# Patient Record
Sex: Male | Born: 1995 | Race: White | Hispanic: No | Marital: Single | Smoking: Current some day smoker
Health system: Southern US, Community
[De-identification: ages and names within clinical notes are randomized; demographics above are authoritative.]

## PROBLEM LIST (undated history)

## (undated) DIAGNOSIS — Z85828 Personal history of other malignant neoplasm of skin: Secondary | ICD-10-CM

## (undated) DIAGNOSIS — L309 Dermatitis, unspecified: Secondary | ICD-10-CM

## (undated) DIAGNOSIS — F909 Attention-deficit hyperactivity disorder, unspecified type: Secondary | ICD-10-CM

## (undated) DIAGNOSIS — J45909 Unspecified asthma, uncomplicated: Secondary | ICD-10-CM

## (undated) HISTORY — PX: WISDOM TOOTH EXTRACTION: SHX21

## (undated) HISTORY — PX: TONSILLECTOMY: SUR1361

---
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
## 2018-02-28 ENCOUNTER — Ambulatory Visit (INDEPENDENT_AMBULATORY_CARE_PROVIDER_SITE_OTHER)
Admission: EM | Admit: 2018-02-28 | Discharge: 2018-02-28 | Disposition: A | Discharge status: Home / Self Care | Payer: Managed Care, Other (non HMO) | Source: Home / Self Care

## 2018-02-28 ENCOUNTER — Encounter (HOSPITAL_COMMUNITY): Payer: Self-pay | Admitting: Emergency Medicine

## 2018-02-28 ENCOUNTER — Emergency Department (HOSPITAL_COMMUNITY)
Admission: EM | Admit: 2018-02-28 | Discharge: 2018-03-01 | Disposition: A | Discharge status: Home / Self Care | Payer: Managed Care, Other (non HMO) | Attending: Emergency Medicine | Admitting: Emergency Medicine

## 2018-02-28 ENCOUNTER — Other Ambulatory Visit: Payer: Self-pay

## 2018-02-28 ENCOUNTER — Encounter (HOSPITAL_COMMUNITY): Payer: Self-pay

## 2018-02-28 DIAGNOSIS — Y33XXXA Other specified events, undetermined intent, initial encounter: Secondary | ICD-10-CM | POA: Insufficient documentation

## 2018-02-28 DIAGNOSIS — Z5321 Procedure and treatment not carried out due to patient leaving prior to being seen by health care provider: Secondary | ICD-10-CM | POA: Insufficient documentation

## 2018-02-28 DIAGNOSIS — S51811A Laceration without foreign body of right forearm, initial encounter: Secondary | ICD-10-CM | POA: Diagnosis not present

## 2018-02-28 DIAGNOSIS — W25XXXA Contact with sharp glass, initial encounter: Secondary | ICD-10-CM

## 2018-02-28 DIAGNOSIS — Y929 Unspecified place or not applicable: Secondary | ICD-10-CM | POA: Insufficient documentation

## 2018-02-28 DIAGNOSIS — Y998 Other external cause status: Secondary | ICD-10-CM | POA: Insufficient documentation

## 2018-02-28 DIAGNOSIS — Y939 Activity, unspecified: Secondary | ICD-10-CM | POA: Diagnosis not present

## 2018-02-28 DIAGNOSIS — S61512A Laceration without foreign body of left wrist, initial encounter: Secondary | ICD-10-CM | POA: Diagnosis not present

## 2018-02-28 NOTE — Discharge Instructions (Signed)
Patient was redirected to the ER.  His girlfriend was very upset and did not allow for appropriate discharge, I did not get a chance to provide patient with his AVS.

## 2018-02-28 NOTE — ED Triage Notes (Signed)
Pt here for a laceration for to left arm on Saturday. Wound is approximately 3 cm long.  No active bleeding.  Open wound wrapped with gauze.  A&Ox4

## 2018-02-28 NOTE — ED Triage Notes (Signed)
Pt sts laceration to right forearm after hitting glass table on Saturday night

## 2018-02-28 NOTE — ED Provider Notes (Signed)
  MRN: [id number] DOB: 1996-01-11  Subjective:   Paul Duke is a 23 y.o. male presenting for 2.5-day history of right forearm wound from handling glass.  Patient reports that he is try to keep his wound clean and wrapped.  Reports that his last Tdap was within the last 5 years.  Denies fever but admits that there is some redness of his forearm, reports a history of eczema.  No current facility-administered medications for this encounter.   Current Outpatient Medications:  .  amphetamine-dextroamphetamine (ADDERALL XR) 10 MG 24 hr capsule, Take 10 mg by mouth every morning., Disp: , Rfl:    No Known Allergies  Past Medical History:  Diagnosis Date  . Eczema      History reviewed. No pertinent surgical history.  ROS  Objective:   Vitals: BP 131/74 (BP Location: Left Arm)   Pulse 88   Temp 98.9 F (37.2 C) (Temporal)   Resp 18   SpO2 100%   Physical Exam Constitutional:      Appearance: Normal appearance. He is well-developed and normal weight.  HENT:     Head: Normocephalic and atraumatic.     Right Ear: External ear normal.     Left Ear: External ear normal.     Nose: Nose normal.     Mouth/Throat:     Pharynx: Oropharynx is clear.  Eyes:     Extraocular Movements: Extraocular movements intact.     Pupils: Pupils are equal, round, and reactive to light.  Cardiovascular:     Rate and Rhythm: Normal rate.  Pulmonary:     Effort: Pulmonary effort is normal.  Skin:         Comments: Over the flexor surface of his forearm he does have eczematous type rash.  Neurological:     Mental Status: He is alert and oriented to person, place, and time.  Psychiatric:        Mood and Affect: Mood normal.        Behavior: Behavior normal.    Assessment and Plan :   Laceration of right forearm, initial encounter  Patient's wound is approximately 48 hours old.  I examined patient with Dr. Marcille Blanco and we both agree that there is concern for cellulitis and potentially  developing systemic infection.  We redirected patient toward the ER for more emergent evaluation.  Patient contracted for safety and will report to the ER immediately.   Jaynee Eagles, PA-C 02/28/18 2003

## 2018-03-01 NOTE — ED Notes (Signed)
Pt didn't answer for vitals recheck x1

## 2018-03-01 NOTE — ED Notes (Signed)
Pt called again with no answer 

## 2019-04-18 ENCOUNTER — Other Ambulatory Visit: Payer: Self-pay | Admitting: Otolaryngology

## 2019-04-18 DIAGNOSIS — C44202 Unspecified malignant neoplasm of skin of right ear and external auricular canal: Secondary | ICD-10-CM

## 2019-04-25 ENCOUNTER — Other Ambulatory Visit: Payer: Managed Care, Other (non HMO)

## 2019-04-25 ENCOUNTER — Ambulatory Visit
Admission: RE | Admit: 2019-04-25 | Discharge: 2019-04-25 | Disposition: A | Discharge status: Home / Self Care | Payer: Managed Care, Other (non HMO) | Source: Ambulatory Visit | Attending: Otolaryngology | Admitting: Otolaryngology

## 2019-04-25 ENCOUNTER — Encounter (HOSPITAL_BASED_OUTPATIENT_CLINIC_OR_DEPARTMENT_OTHER): Payer: Self-pay | Admitting: Otolaryngology

## 2019-04-25 ENCOUNTER — Other Ambulatory Visit: Payer: Self-pay | Admitting: Otolaryngology

## 2019-04-25 DIAGNOSIS — C44202 Unspecified malignant neoplasm of skin of right ear and external auricular canal: Secondary | ICD-10-CM

## 2019-04-25 MED ORDER — IOPAMIDOL (ISOVUE-300) INJECTION 61%
75.0000 mL | Freq: Once | INTRAVENOUS | Status: AC | PRN
  Administered 2019-04-25: 13:00:00 75 mL via INTRAVENOUS

## 2019-04-26 ENCOUNTER — Other Ambulatory Visit: Payer: Self-pay

## 2019-04-26 ENCOUNTER — Encounter (HOSPITAL_BASED_OUTPATIENT_CLINIC_OR_DEPARTMENT_OTHER): Payer: Self-pay | Admitting: Otolaryngology

## 2019-04-27 ENCOUNTER — Other Ambulatory Visit (HOSPITAL_COMMUNITY)
Admission: RE | Admit: 2019-04-27 | Discharge: 2019-04-27 | Disposition: A | Discharge status: Home / Self Care | Payer: Managed Care, Other (non HMO) | Source: Ambulatory Visit | Attending: Otolaryngology | Admitting: Otolaryngology

## 2019-04-27 DIAGNOSIS — Z01812 Encounter for preprocedural laboratory examination: Secondary | ICD-10-CM | POA: Diagnosis not present

## 2019-04-27 DIAGNOSIS — Z20822 Contact with and (suspected) exposure to covid-19: Secondary | ICD-10-CM | POA: Diagnosis not present

## 2019-04-27 LAB — SARS CORONAVIRUS 2 (TAT 6-24 HRS): SARS Coronavirus 2: NEGATIVE

## 2019-05-01 ENCOUNTER — Ambulatory Visit (HOSPITAL_BASED_OUTPATIENT_CLINIC_OR_DEPARTMENT_OTHER)
Admission: RE | Admit: 2019-05-01 | Discharge: 2019-05-01 | Disposition: A | Discharge status: Home / Self Care | Payer: Managed Care, Other (non HMO) | Attending: Otolaryngology | Admitting: Otolaryngology

## 2019-05-01 ENCOUNTER — Ambulatory Visit (HOSPITAL_BASED_OUTPATIENT_CLINIC_OR_DEPARTMENT_OTHER): Payer: Managed Care, Other (non HMO) | Admitting: Anesthesiology

## 2019-05-01 ENCOUNTER — Encounter (HOSPITAL_BASED_OUTPATIENT_CLINIC_OR_DEPARTMENT_OTHER): Payer: Self-pay | Admitting: Otolaryngology

## 2019-05-01 ENCOUNTER — Encounter (HOSPITAL_BASED_OUTPATIENT_CLINIC_OR_DEPARTMENT_OTHER)
Admission: RE | Disposition: A | Discharge status: Home / Self Care | Payer: Self-pay | Source: Home / Self Care | Attending: Otolaryngology

## 2019-05-01 DIAGNOSIS — C44202 Unspecified malignant neoplasm of skin of right ear and external auricular canal: Secondary | ICD-10-CM | POA: Diagnosis present

## 2019-05-01 DIAGNOSIS — Z79899 Other long term (current) drug therapy: Secondary | ICD-10-CM | POA: Insufficient documentation

## 2019-05-01 DIAGNOSIS — F909 Attention-deficit hyperactivity disorder, unspecified type: Secondary | ICD-10-CM | POA: Diagnosis not present

## 2019-05-01 DIAGNOSIS — J45909 Unspecified asthma, uncomplicated: Secondary | ICD-10-CM | POA: Insufficient documentation

## 2019-05-01 DIAGNOSIS — F17200 Nicotine dependence, unspecified, uncomplicated: Secondary | ICD-10-CM | POA: Diagnosis not present

## 2019-05-01 HISTORY — DX: Attention-deficit hyperactivity disorder, unspecified type: F90.9

## 2019-05-01 HISTORY — DX: Personal history of other malignant neoplasm of skin: Z85.828

## 2019-05-01 HISTORY — DX: Unspecified asthma, uncomplicated: J45.909

## 2019-05-01 HISTORY — PX: LESION EXCISION WITH COMPLEX REPAIR: SHX6700

## 2019-05-01 SURGERY — LESION EXCISION WITH COMPLEX REPAIR
Anesthesia: General | Site: Ear | Laterality: Right

## 2019-05-01 MED ORDER — LIDOCAINE 2% (20 MG/ML) 5 ML SYRINGE
INTRAMUSCULAR | Status: DC | PRN
  Administered 2019-05-01: 60 mg via INTRAVENOUS

## 2019-05-01 MED ORDER — LACTATED RINGERS IV SOLN: INTRAVENOUS | Status: DC

## 2019-05-01 MED ORDER — ONDANSETRON HCL 4 MG/2ML IJ SOLN
INTRAMUSCULAR | Status: DC | PRN
  Administered 2019-05-01: 4 mg via INTRAVENOUS

## 2019-05-01 MED ORDER — CEFAZOLIN SODIUM-DEXTROSE 2-4 GM/100ML-% IV SOLN
2.0000 g | INTRAVENOUS | Status: AC
  Administered 2019-05-01: 2 g via INTRAVENOUS

## 2019-05-01 MED ORDER — CEFAZOLIN SODIUM-DEXTROSE 2-4 GM/100ML-% IV SOLN
INTRAVENOUS | Status: AC
  Filled 2019-05-01: qty 100

## 2019-05-01 MED ORDER — DEXAMETHASONE SODIUM PHOSPHATE 4 MG/ML IJ SOLN
INTRAMUSCULAR | Status: DC | PRN
  Administered 2019-05-01: 10 mg via INTRAVENOUS

## 2019-05-01 MED ORDER — ACETAMINOPHEN 500 MG PO TABS
ORAL_TABLET | ORAL | Status: AC
  Filled 2019-05-01: qty 2

## 2019-05-01 MED ORDER — MIDAZOLAM HCL 2 MG/2ML IJ SOLN
INTRAMUSCULAR | Status: AC
  Filled 2019-05-01: qty 2

## 2019-05-01 MED ORDER — HYDROMORPHONE HCL 1 MG/ML IJ SOLN: 0.2500 mg | INTRAMUSCULAR | Status: DC | PRN

## 2019-05-01 MED ORDER — CELECOXIB 200 MG PO CAPS
200.0000 mg | ORAL_CAPSULE | Freq: Once | ORAL | Status: AC
  Administered 2019-05-01: 200 mg via ORAL

## 2019-05-01 MED ORDER — FENTANYL CITRATE (PF) 100 MCG/2ML IJ SOLN
50.0000 ug | INTRAMUSCULAR | Status: DC | PRN
  Administered 2019-05-01: 100 ug via INTRAVENOUS
  Administered 2019-05-01: 12:00:00 50 ug via INTRAVENOUS

## 2019-05-01 MED ORDER — FENTANYL CITRATE (PF) 100 MCG/2ML IJ SOLN
INTRAMUSCULAR | Status: AC
  Filled 2019-05-01: qty 2

## 2019-05-01 MED ORDER — ACETAMINOPHEN 500 MG PO TABS
1000.0000 mg | ORAL_TABLET | Freq: Once | ORAL | Status: AC
  Administered 2019-05-01: 1000 mg via ORAL

## 2019-05-01 MED ORDER — PROPOFOL 10 MG/ML IV BOLUS
INTRAVENOUS | Status: DC | PRN
  Administered 2019-05-01: 200 mg via INTRAVENOUS

## 2019-05-01 MED ORDER — LIDOCAINE-EPINEPHRINE 1 %-1:100000 IJ SOLN
INTRAMUSCULAR | Status: DC | PRN
  Administered 2019-05-01: 3 mL

## 2019-05-01 MED ORDER — LIDOCAINE 2% (20 MG/ML) 5 ML SYRINGE
INTRAMUSCULAR | Status: AC
  Filled 2019-05-01: qty 5

## 2019-05-01 MED ORDER — DEXAMETHASONE SODIUM PHOSPHATE 10 MG/ML IJ SOLN
INTRAMUSCULAR | Status: AC
  Filled 2019-05-01: qty 1

## 2019-05-01 MED ORDER — MIDAZOLAM HCL 2 MG/2ML IJ SOLN
1.0000 mg | INTRAMUSCULAR | Status: DC | PRN
  Administered 2019-05-01: 2 mg via INTRAVENOUS

## 2019-05-01 MED ORDER — CELECOXIB 200 MG PO CAPS
ORAL_CAPSULE | ORAL | Status: AC
  Filled 2019-05-01: qty 1

## 2019-05-01 MED ORDER — ONDANSETRON HCL 4 MG/2ML IJ SOLN
INTRAMUSCULAR | Status: AC
  Filled 2019-05-01: qty 2

## 2019-05-01 SURGICAL SUPPLY — 61 items
BENZOIN TINCTURE PRP APPL 2/3 (GAUZE/BANDAGES/DRESSINGS) IMPLANT
BLADE SURG 10 STRL SS (BLADE) IMPLANT
BLADE SURG 15 STRL LF DISP TIS (BLADE) ×1 IMPLANT
BLADE SURG 15 STRL SS (BLADE) ×2
BNDG CONFORM 3 STRL LF (GAUZE/BANDAGES/DRESSINGS) IMPLANT
BNDG GAUZE ELAST 4 BULKY (GAUZE/BANDAGES/DRESSINGS) IMPLANT
CANISTER SUCT 1200ML W/VALVE (MISCELLANEOUS) IMPLANT
CLEANER CAUTERY TIP 5X5 PAD (MISCELLANEOUS) IMPLANT
CLOSURE WOUND 1/4X4 (GAUZE/BANDAGES/DRESSINGS)
CORD BIPOLAR FORCEPS 12FT (ELECTRODE) IMPLANT
COVER BACK TABLE 60X90IN (DRAPES) ×3 IMPLANT
COVER MAYO STAND STRL (DRAPES) ×3 IMPLANT
COVER WAND RF STERILE (DRAPES) IMPLANT
DECANTER SPIKE VIAL GLASS SM (MISCELLANEOUS) IMPLANT
DRAIN PENROSE 1/4X12 LTX STRL (WOUND CARE) IMPLANT
DRAPE U-SHAPE 76X120 STRL (DRAPES) ×3 IMPLANT
DRSG GLASSCOCK MASTOID ADT (GAUZE/BANDAGES/DRESSINGS) IMPLANT
ELECT COATED BLADE 2.86 ST (ELECTRODE) IMPLANT
ELECT NEEDLE BLADE 2-5/6 (NEEDLE) ×3 IMPLANT
ELECT REM PT RETURN 9FT ADLT (ELECTROSURGICAL) ×3
ELECTRODE REM PT RTRN 9FT ADLT (ELECTROSURGICAL) ×1 IMPLANT
GAUZE SPONGE 4X4 12PLY STRL LF (GAUZE/BANDAGES/DRESSINGS) IMPLANT
GAUZE XEROFORM 1X8 LF (GAUZE/BANDAGES/DRESSINGS) ×6 IMPLANT
GLOVE BIO SURGEON STRL SZ7.5 (GLOVE) ×3 IMPLANT
GOWN STRL REUS W/ TWL LRG LVL3 (GOWN DISPOSABLE) ×2 IMPLANT
GOWN STRL REUS W/TWL LRG LVL3 (GOWN DISPOSABLE) ×4
IV CATH 24GX3/4 RADIO (IV SOLUTION) IMPLANT
NEEDLE HYPO 25X1 1.5 SAFETY (NEEDLE) ×3 IMPLANT
NEEDLE PRECISIONGLIDE 27X1.5 (NEEDLE) IMPLANT
NS IRRIG 1000ML POUR BTL (IV SOLUTION) ×3 IMPLANT
PACK BASIN DAY SURGERY FS (CUSTOM PROCEDURE TRAY) ×3 IMPLANT
PAD CLEANER CAUTERY TIP 5X5 (MISCELLANEOUS)
PENCIL SMOKE EVACUATOR (MISCELLANEOUS) ×3 IMPLANT
SHEET MEDIUM DRAPE 40X70 STRL (DRAPES) IMPLANT
SPONGE GAUZE 2X2 8PLY STER LF (GAUZE/BANDAGES/DRESSINGS)
SPONGE GAUZE 2X2 8PLY STRL LF (GAUZE/BANDAGES/DRESSINGS) IMPLANT
STRIP CLOSURE SKIN 1/4X4 (GAUZE/BANDAGES/DRESSINGS) IMPLANT
SUCTION FRAZIER HANDLE 10FR (MISCELLANEOUS) ×2
SUCTION TUBE FRAZIER 10FR DISP (MISCELLANEOUS) ×1 IMPLANT
SUT CHROMIC 4 0 P 3 18 (SUTURE) IMPLANT
SUT ETHILON 4 0 PS 2 18 (SUTURE) ×9 IMPLANT
SUT ETHILON 5 0 P 3 18 (SUTURE)
SUT NYLON ETHILON 5-0 P-3 1X18 (SUTURE) IMPLANT
SUT PLAIN 5 0 P 3 18 (SUTURE) IMPLANT
SUT PROLENE 4 0 P 3 18 (SUTURE) IMPLANT
SUT PROLENE 5 0 P 3 (SUTURE) IMPLANT
SUT SILK 3 0 PS 1 (SUTURE) ×3 IMPLANT
SUT SILK 4 0 TIES 17X18 (SUTURE) IMPLANT
SUT VIC AB 4-0 P-3 18XBRD (SUTURE) IMPLANT
SUT VIC AB 4-0 P3 18 (SUTURE)
SUT VIC AB 5-0 P-3 18X BRD (SUTURE) IMPLANT
SUT VIC AB 5-0 P3 18 (SUTURE)
SWAB COLLECTION DEVICE MRSA (MISCELLANEOUS) IMPLANT
SWAB CULTURE ESWAB REG 1ML (MISCELLANEOUS) IMPLANT
SYR BULB 3OZ (MISCELLANEOUS) IMPLANT
SYR CONTROL 10ML LL (SYRINGE) ×3 IMPLANT
SYR TB 1ML LL NO SAFETY (SYRINGE) IMPLANT
TOWEL GREEN STERILE FF (TOWEL DISPOSABLE) ×3 IMPLANT
TRAY DSU PREP LF (CUSTOM PROCEDURE TRAY) ×3 IMPLANT
TUBE CONNECTING 20'X1/4 (TUBING)
TUBE CONNECTING 20X1/4 (TUBING) IMPLANT

## 2019-05-01 NOTE — Anesthesia Preprocedure Evaluation (Addendum)
Anesthesia Evaluation  Patient identified by MRN, date of birth, ID band Patient awake    Reviewed: Allergy & Precautions, H&P , NPO status , Patient's Chart, lab work & pertinent test results  Airway Mallampati: II  TM Distance: >3 FB Neck ROM: Full    Dental no notable dental hx. (+) Teeth Intact   Pulmonary asthma , Current Smoker,    Pulmonary exam normal        Cardiovascular negative cardio ROS Normal cardiovascular exam     Neuro/Psych negative neurological ROS  negative psych ROS   GI/Hepatic negative GI ROS, Neg liver ROS,   Endo/Other  negative endocrine ROS  Renal/GU negative Renal ROS  negative genitourinary   Musculoskeletal   Abdominal   Peds  Hematology negative hematology ROS (+)   Anesthesia Other Findings   Reproductive/Obstetrics negative OB ROS                            Anesthesia Physical Anesthesia Plan  ASA: II  Anesthesia Plan: General   Post-op Pain Management:    Induction: Intravenous  PONV Risk Score and Plan: 1 and Ondansetron, Dexamethasone, Midazolam and Treatment may vary due to age or medical condition  Airway Management Planned: Oral ETT  Additional Equipment: None  Intra-op Plan:   Post-operative Plan: Extubation in OR  Informed Consent: I have reviewed the patients History and Physical, chart, labs and discussed the procedure including the risks, benefits and alternatives for the proposed anesthesia with the patient or authorized representative who has indicated his/her understanding and acceptance.     Dental advisory given  Plan Discussed with: CRNA  Anesthesia Plan Comments:        Anesthesia Quick Evaluation

## 2019-05-01 NOTE — Brief Op Note (Signed)
05/01/2019  12:55 PM  PATIENT:  Paul Duke  24 y.o. male  PRE-OPERATIVE DIAGNOSIS:  RIGHT EAR MALIGNANT TUMOR  POST-OPERATIVE DIAGNOSIS:  RIGHT EAR MALIGNANT TUMOR  PROCEDURE:  Procedure(s): EXCISION RIGHT EAR LESION (Right) 2 cm  SURGEON:  Surgeon(s) and Role:    Melida Quitter, MD - Primary  PHYSICIAN ASSISTANT:   ASSISTANTS: none   ANESTHESIA:   general  EBL: Minimal  BLOOD ADMINISTERED:none  DRAINS: none   LOCAL MEDICATIONS USED:  LIDOCAINE   SPECIMEN:  Source of Specimen:  Right auricle tumor, long suture marks anterior, short suture marks superior  DISPOSITION OF SPECIMEN:  PATHOLOGY  COUNTS:  YES  TOURNIQUET:  * No tourniquets in log *  DICTATION: .Other Dictation: Dictation Number [id number]  PLAN OF CARE: Discharge to home after PACU  PATIENT DISPOSITION:  PACU - hemodynamically stable.   Delay start of Pharmacological VTE agent (>24hrs) due to surgical blood loss or risk of bleeding: no

## 2019-05-01 NOTE — Anesthesia Postprocedure Evaluation (Signed)
Anesthesia Post Note  Patient: Lakendrick Tomaselli  Procedure(s) Performed: EXCISION RIGHT EAR LESION (Right Ear)     Patient location during evaluation: PACU Anesthesia Type: General Level of consciousness: awake and alert Pain management: pain level controlled Vital Signs Assessment: post-procedure vital signs reviewed and stable Respiratory status: spontaneous breathing, nonlabored ventilation and respiratory function stable Cardiovascular status: blood pressure returned to baseline and stable Postop Assessment: no apparent nausea or vomiting Anesthetic complications: no    Last Vitals:  Vitals:   05/01/19 1330 05/01/19 1345  BP: 105/71 108/71  Pulse: 61 63  Resp: 14 14  Temp:    SpO2: 100% 100%    Last Pain:  Vitals:   05/01/19 1345  TempSrc:   PainSc: 0-No pain                 Lidia Collum

## 2019-05-01 NOTE — Discharge Instructions (Signed)
Call your surgeon if you experience:   1.  Fever over 101.0. 2.  Inability to urinate. 3.  Nausea and/or vomiting. 4.  Extreme swelling or bruising at the surgical site. 5.  Continued bleeding from the incision. 6.  Increased pain, redness or drainage from the incision. 7.  Problems related to your pain medication. 8.  Any problems and/or concerns   NO TYLENOL PRODUCTS UNTIL 4:30 PM   NO ADVIL/ MOTRIN UNTIL 6:30 PM       Post Anesthesia Home Care Instructions  Activity: Get plenty of rest for the remainder of the day. A responsible individual must stay with you for 24 hours following the procedure.  For the next 24 hours, DO NOT: -Drive a car -Paediatric nurse -Drink alcoholic beverages -Take any medication unless instructed by your physician -Make any legal decisions or sign important papers.  Meals: Start with liquid foods such as gelatin or soup. Progress to regular foods as tolerated. Avoid greasy, spicy, heavy foods. If nausea and/or vomiting occur, drink only clear liquids until the nausea and/or vomiting subsides. Call your physician if vomiting continues.  Special Instructions/Symptoms: Your throat may feel dry or sore from the anesthesia or the breathing tube placed in your throat during surgery. If this causes discomfort, gargle with warm salt water. The discomfort should disappear within 24 hours.  If you had a scopolamine patch placed behind your ear for the management of post- operative nausea and/or vomiting:  1. The medication in the patch is effective for 72 hours, after which it should be removed.  Wrap patch in a tissue and discard in the trash. Wash hands thoroughly with soap and water. 2. You may remove the patch earlier than 72 hours if you experience unpleasant side effects which may include dry mouth, dizziness or visual disturbances. 3. Avoid touching the patch. Wash your hands with soap and water after contact with the patch.

## 2019-05-01 NOTE — Op Note (Signed)
NAMETYLOR, DUCHOW MEDICAL RECORD [id number] ACCOUNT [id number] DATE OF BIRTH:09/17/1995 FACILITY: [facility] LOCATION: [facility] PHYSICIAN:Kailand Seda Guido Sander, MD  OPERATIVE REPORT  DATE OF PROCEDURE:  05/01/2019  PREOPERATIVE DIAGNOSIS:  Right auricular malignant tumor.  POSTOPERATIVE DIAGNOSIS:  Right auricular malignant tumor.  PROCEDURE:  Excision of right auricular malignant tumor, 2 cm.  SURGEON:  Melida Quitter, MD  ANESTHESIA:  General LMA anesthesia.  COMPLICATIONS:  None.  INDICATIONS:  The patient is a 24 year old male who presented to the office with a several month history of what appeared to represent an auricular hematoma, although he did not have a previous traumatic injury.  An in office incision and drainage was  performed and a portion of the overlying cartilage was removed and sent to pathology, demonstrating a low-grade malignant tumor.  He presents to the operating room for an excision of this tumor.  FINDINGS:  From the previous procedure, the tumor was within 2 lamina of cartilage.  Thus, the remainder of the cartilage of the anterior scaphoid region was removed, along with the overlying skin.  Grossly, it was difficult to tell where the edge of the  tumor would reside as there was not much of a mass effect.  The specimen was marked for orientation and sent for pathology with the wound left packed as opposed to closing it due to being unclear about margins.  DESCRIPTION OF PROCEDURE:  The patient was identified in the holding room.  Informed consent having been obtained with discussion of risks, benefits and alternatives, the patient was brought to the operative suite and put on the operative table in the  supine position.  Anesthesia was induced and the patient was intubated by the anesthesia team with an LMA without difficulty.  The patient was given intravenous antibiotics during the case.  The right ear was injected with 1% lidocaine with 1:100,000  epinephrine.   The ear and side of the head were prepped and draped in sterile fashion.  Incision was made under the margin of the helical rim and extending about halfway back on the scaphoid region, coming down across that region and then along the  antihelical fold.  Dissection was then performed deep to the skin and through the cartilage in that same position and the cartilage with overlying skin was resected by dissecting underneath it and taking it out en bloc.  The anterior margin was marked  with a long suture and the superior margin was marked with a short suture.  The specimen was passed to nursing for pathology.  The wound was then made hemostatic with electrocautery.  After careful examination of the wound, 2 Xeroform strips were placed  down in the depth of the wound and secured in place with 4-0 nylon suture running across the wound in 2 orientations.  After this, the ear was cleaned off and the patient returned to anesthesia for wakeup, was extubated and taken to the recovery room in  stable condition.  VN/NUANCE  D:05/01/2019 T:05/01/2019 JOB:[id number]

## 2019-05-01 NOTE — Transfer of Care (Signed)
Immediate Anesthesia Transfer of Care Note  Patient: Paul Duke  Procedure(s) Performed: EXCISION RIGHT EAR LESION (Right Ear)  Patient Location: PACU  Anesthesia Type:General  Level of Consciousness: drowsy  Airway & Oxygen Therapy: Patient Spontanous Breathing and Patient connected to face mask oxygen  Post-op Assessment: Report given to RN and Post -op Vital signs reviewed and stable  Post vital signs: Reviewed and stable  Last Vitals:  Vitals Value Taken Time  BP 93/59 05/01/19 1257  Temp    Pulse 62 05/01/19 1300  Resp 13 05/01/19 1300  SpO2 100 % 05/01/19 1300  Vitals shown include unvalidated device data.  Last Pain:  Vitals:   05/01/19 1008  TempSrc: Temporal  PainSc: 0-No pain      Patients Stated Pain Goal: 0 ([id number])  Complications: No apparent anesthesia complications

## 2019-05-01 NOTE — Anesthesia Procedure Notes (Signed)
Procedure Name: LMA Insertion Date/Time: 05/01/2019 12:10 PM Performed by: Lieutenant Diego, CRNA Pre-anesthesia Checklist: Patient identified, Emergency Drugs available, Suction available and Patient being monitored Patient Re-evaluated:Patient Re-evaluated prior to induction Oxygen Delivery Method: Circle system utilized Preoxygenation: Pre-oxygenation with 100% oxygen Induction Type: IV induction Ventilation: Mask ventilation without difficulty LMA: LMA inserted LMA Size: 5.0 Number of attempts: 1 Placement Confirmation: positive ETCO2 and breath sounds checked- equal and bilateral Tube secured with: Tape Dental Injury: Teeth and Oropharynx as per pre-operative assessment

## 2019-05-01 NOTE — H&P (Signed)
Paul Duke is an 24 y.o. male.   Chief Complaint: Right ear tumor HPI: 24 year old male developed a fluid pocket in the right auricle in the fall.  At I&D, some of the involved tissue was sent for pathology and returned a low-grade malignancy.  He presents for excision.  Past Medical History:  Diagnosis Date  . ADHD   . Asthma   . Eczema   . History of malignant neoplasm of external ear     Past Surgical History:  Procedure Laterality Date  . TONSILLECTOMY    . WISDOM TOOTH EXTRACTION      History reviewed. No pertinent family history. Social History:  reports that he has been smoking. He has quit using smokeless tobacco. He reports current alcohol use. He reports that he does not use drugs.  Allergies: No Known Allergies  Medications Prior to Admission  Medication Sig Dispense Refill  . amphetamine-dextroamphetamine (ADDERALL XR) 10 MG 24 hr capsule Take 10 mg by mouth every morning.    Marland Kitchen albuterol (ACCUNEB) 0.63 MG/3ML nebulizer solution Take 1 ampule by nebulization every 6 (six) hours as needed for wheezing.    Marland Kitchen albuterol (VENTOLIN HFA) 108 (90 Base) MCG/ACT inhaler Inhale into the lungs every 6 (six) hours as needed for wheezing or shortness of breath.      No results found for this or any previous visit (from the past 48 hour(s)). No results found.  Review of Systems  All other systems reviewed and are negative.   Blood pressure 121/65, pulse 64, temperature 97.8 F (36.6 C), temperature source Temporal, resp. rate 18, height 6\' 2"  (1.88 m), weight 83.5 kg, SpO2 100 %. Physical Exam  Constitutional: He is oriented to person, place, and time. He appears well-developed and well-nourished. No distress.  HENT:  Head: Normocephalic and atraumatic.  Left Ear: External ear normal.  Nose: Nose normal.  Mouth/Throat: Oropharynx is clear and moist.  Right auricle with healing wound under helical root.  Eyes: Pupils are equal, round, and reactive to light. Conjunctivae  and EOM are normal.  Cardiovascular: Normal rate.  Respiratory: Effort normal.  Musculoskeletal:     Cervical back: Normal range of motion and neck supple.  Neurological: He is alert and oriented to person, place, and time. No cranial nerve deficit.  Skin: Skin is warm and dry.  Psychiatric: He has a normal mood and affect. His behavior is normal. Judgment and thought content normal.     Assessment/Plan Right auricle malignant tumor  To OR for excision of right scaphoid portion of auricle, possible full thickness skin graft.  Melida Quitter, MD 05/01/2019, 11:57 AM

## 2019-05-02 ENCOUNTER — Encounter: Payer: Self-pay | Admitting: *Deleted

## 2019-05-09 LAB — SURGICAL PATHOLOGY

## 2021-09-11 IMAGING — CT CT TEMPORAL BONES W/ CM
1 series · 15 of 30 positions shown, 19 images · IV contrast (iopamidol)
Comparison: None.

CLINICAL DATA: 23-year-old male with painful right external ear
tumor/cyst removed 2 weeks ago.

EXAM:
CT TEMPORAL BONES WITH CONTRAST
TECHNIQUE: Axial and coronal plane CT imaging of the petrous temporal bones was
performed with thin-collimation image reconstruction after
intravenous contrast administration. Multiplanar CT image
reconstructions were also generated.
CONTRAST:  75mL [id number] IOPAMIDOL ([id number]) INJECTION 61%

[Series 4: soft tissue · axial · 0.32mm/px · z∈[-120,-44]mm · 15 of 42 slices shown, 19 images]
[im 2/42  brain]
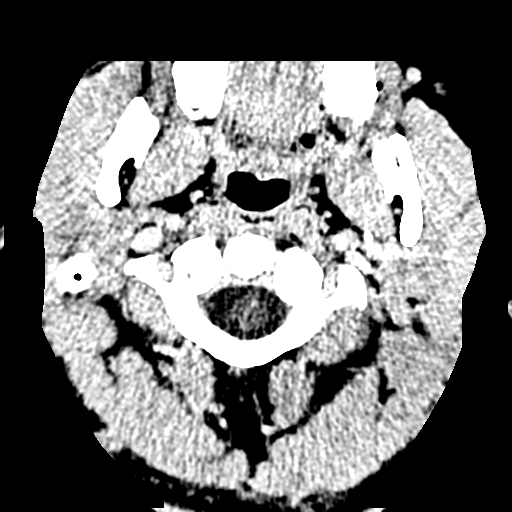
[im 2/42  bone]
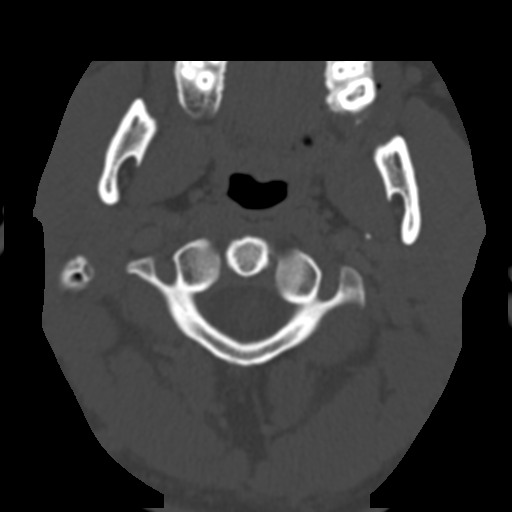
[im 5/42  bone]
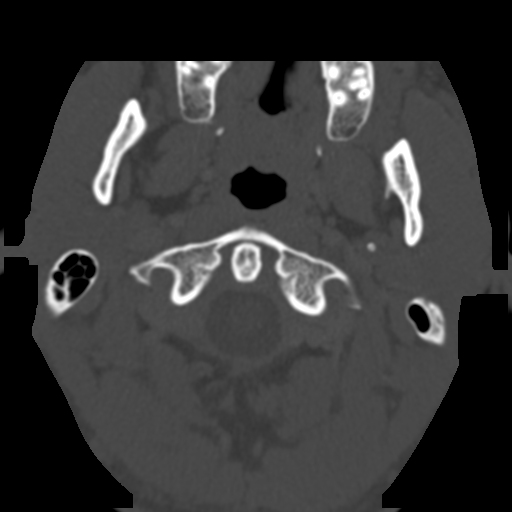
[im 8/42  bone]
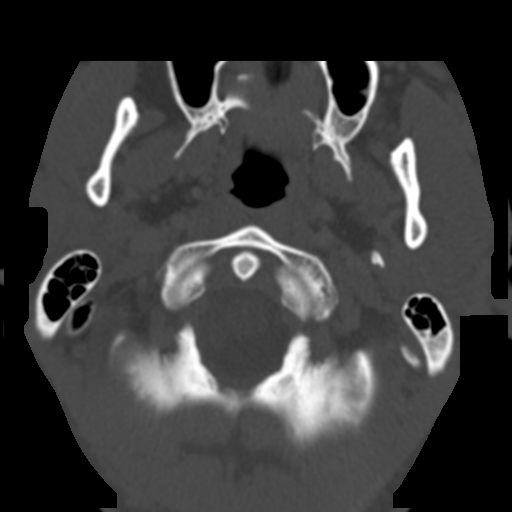
[im 10/42  bone]
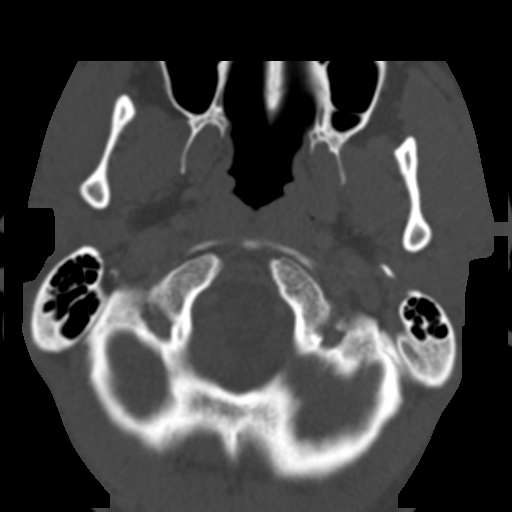
[im 13/42  brain]
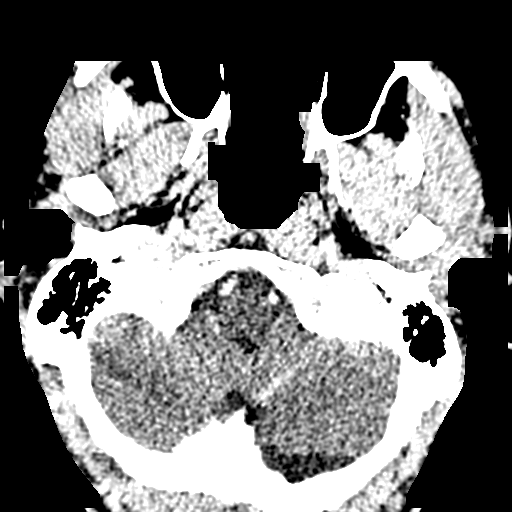
[im 13/42  bone]
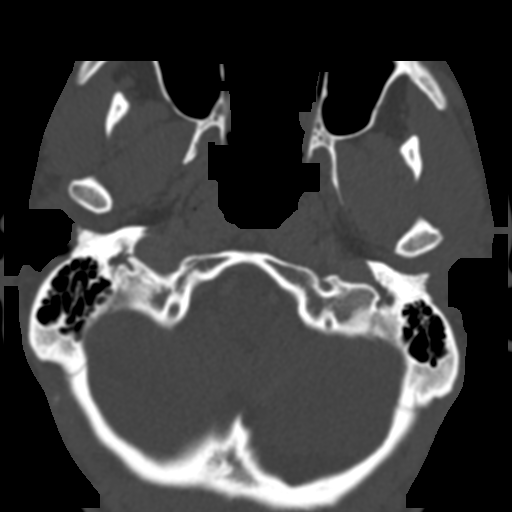
[im 16/42  bone]
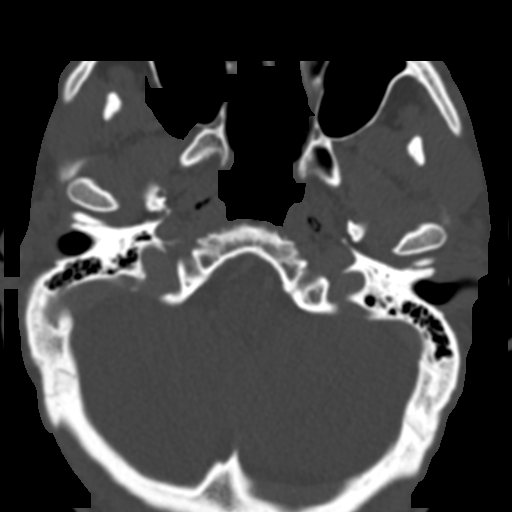
[im 19/42  bone]
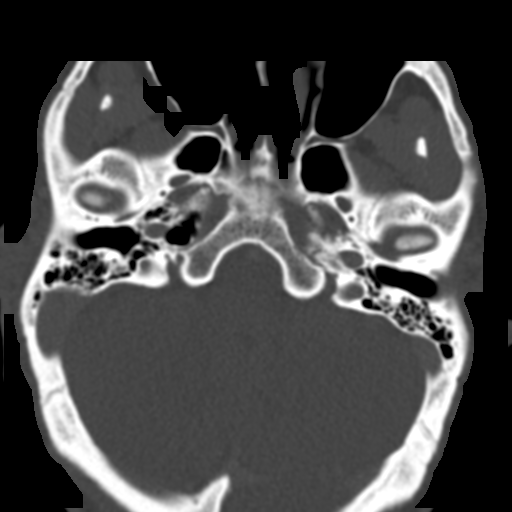
[im 22/42  bone]
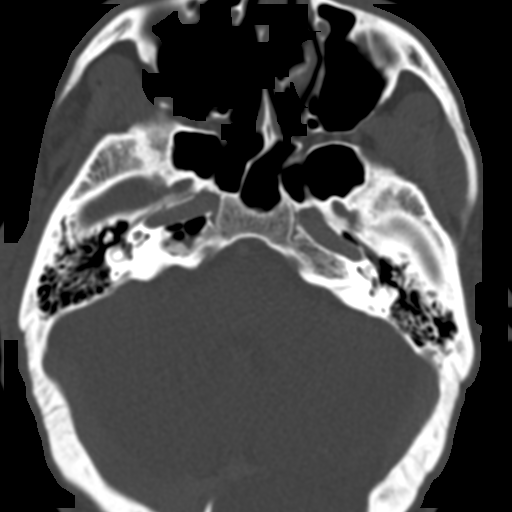
[im 23/42  brain]
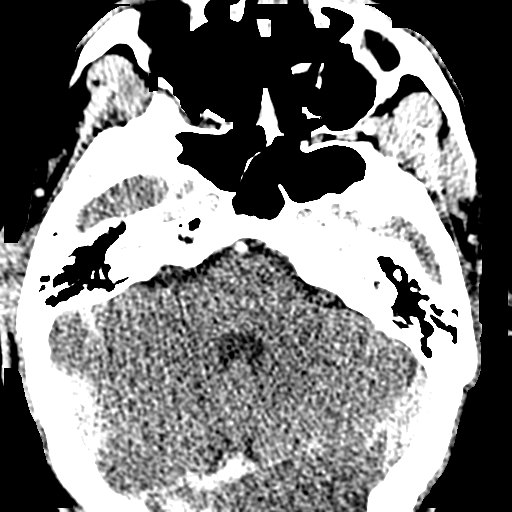
[im 23/42  bone]
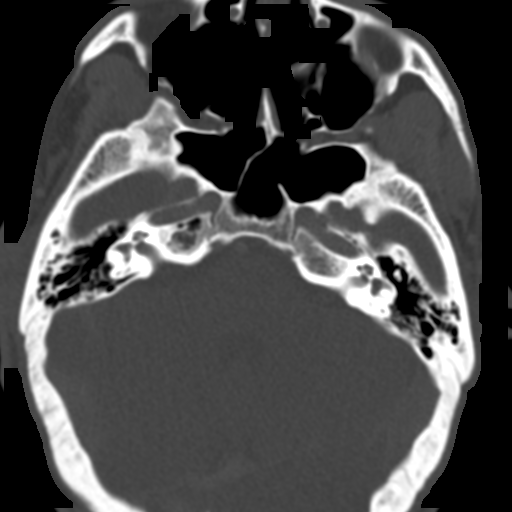
[im 26/42  bone]
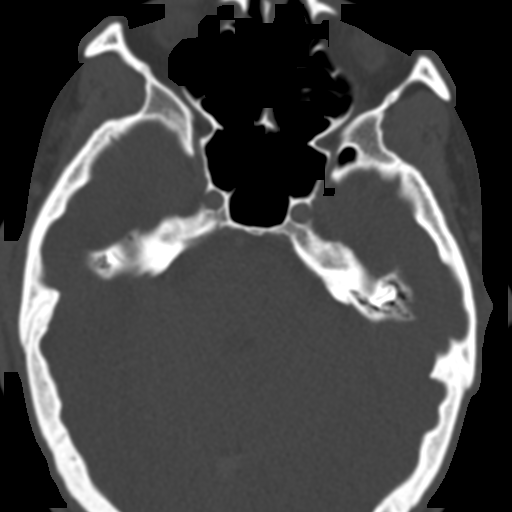
[im 29/42  bone]
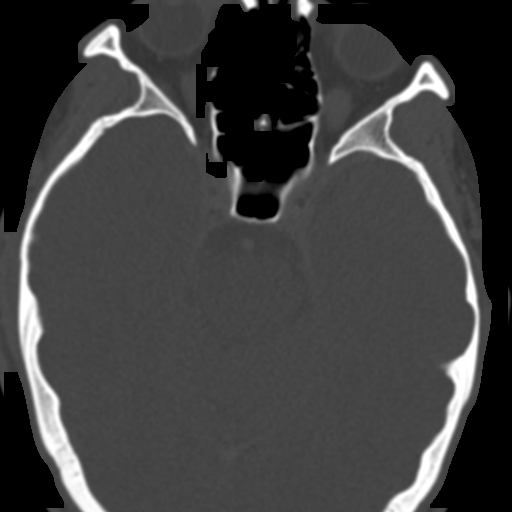
[im 32/42  bone]
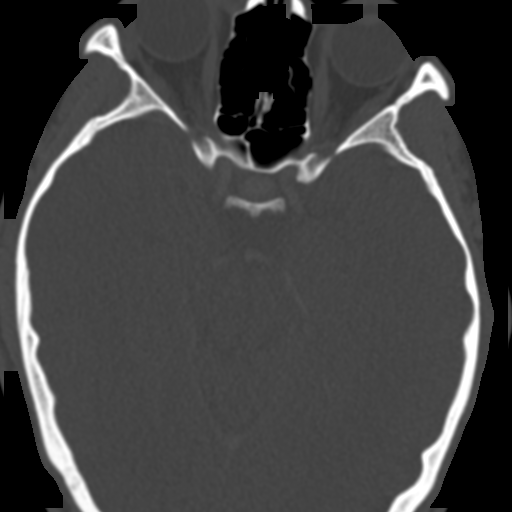
[im 34/42  brain]
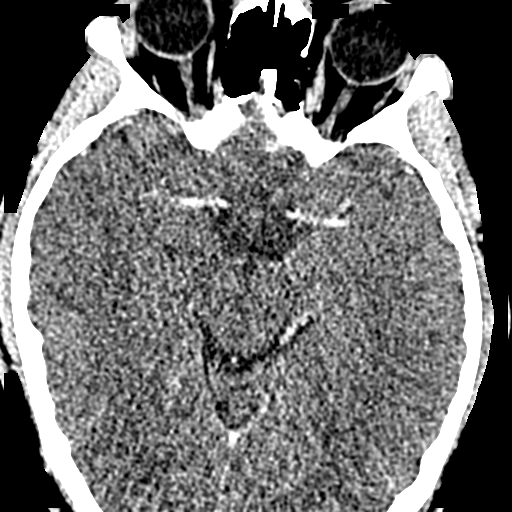
[im 34/42  bone]
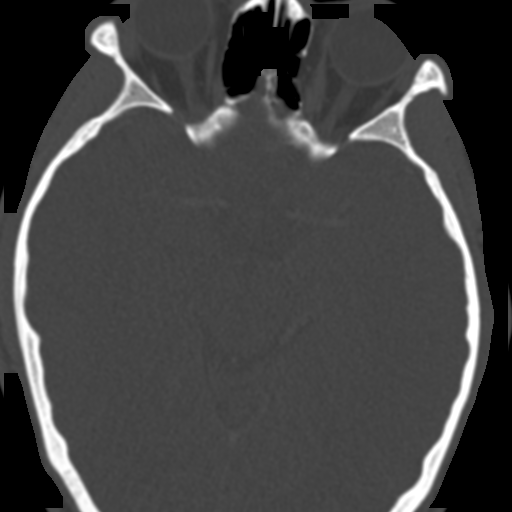
[im 37/42  bone]
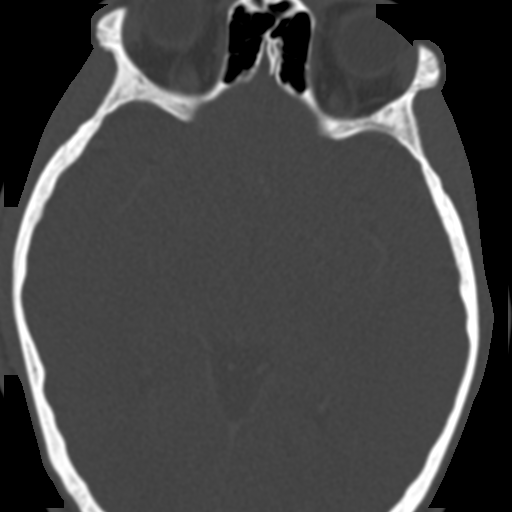
[im 40/42  bone]
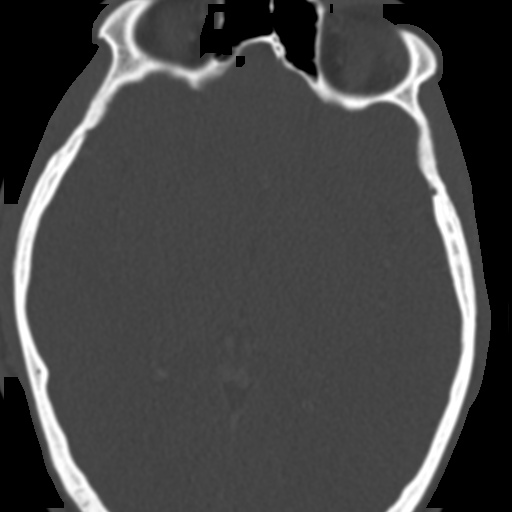

[15 of 30 positions shown; findings below may reference images not displayed]

FINDINGS: The major vascular structures at the skull base appear to be
enhancing and patent. Negative visible brain parenchyma.

Visualized orbits and scalp soft tissues are within normal limits.

Visible bilateral paranasal sinuses are clear. Bone mineralization
is within normal limits.

Negative visible deep soft tissue spaces of the face. The
periauricular soft tissues appear fairly symmetric and within normal
limits. Not all of the right pinna is identified. There is a small
asymmetric punctate calcification or other density along the
superior right EAC seen on series 4, image 16. But no discrete soft
tissue mass.

LEFT TEMPORAL BONE:

Negative left EAC. Normal left tympanic membrane. The left tympanic
cavity is clear. The ossicles appear intact and normally aligned.
Left mastoid antrum and air cells are clear.

Left IAC, cochlea, vestibule and vestibular aqueduct are within
normal limits. Left semicircular canals are within normal limits.
Negative course of the left 7th nerve.

RIGHT TEMPORAL BONE:

The right external auditory canal appears normal. The right tympanic
membrane is normal. The right tympanic cavity is clear. The right
ossicles appear intact and normally aligned. The right mastoid
antrum and air cells are clear.

Right IAC, cochlea, vestibule and vestibular aqueduct are within
normal limits. Right semicircular canals are within normal limits.
Negative course of the right 7th nerve.
IMPRESSION: 1. Minimal asymmetry of the right periauricular soft tissues. No
discrete periauricular mass. There is a small calcification or other
punctate hyperdensity along the superior aspect of the right EAC.

2. Normal CT appearance of both temporal bones.
